# Patient Record
Sex: Female | Born: 1955 | Race: White | Hispanic: No | Marital: Married | Smoking: Never smoker
Health system: Southern US, Community
[De-identification: ages and names within clinical notes are randomized; demographics above are authoritative.]

## PROBLEM LIST (undated history)

## (undated) DIAGNOSIS — E079 Disorder of thyroid, unspecified: Secondary | ICD-10-CM

---

## 1999-03-25 ENCOUNTER — Ambulatory Visit (HOSPITAL_COMMUNITY): Admission: RE | Admit: 1999-03-25 | Discharge: 1999-03-25 | Payer: Self-pay | Admitting: Gastroenterology

## 1999-03-25 ENCOUNTER — Encounter: Payer: Self-pay | Admitting: Gastroenterology

## 1999-08-07 ENCOUNTER — Other Ambulatory Visit: Admission: RE | Admit: 1999-08-07 | Discharge: 1999-08-07 | Payer: Self-pay | Admitting: Family Medicine

## 1999-09-17 ENCOUNTER — Ambulatory Visit (HOSPITAL_COMMUNITY): Admission: RE | Admit: 1999-09-17 | Discharge: 1999-09-17 | Payer: Self-pay | Admitting: Obstetrics and Gynecology

## 1999-11-17 ENCOUNTER — Encounter: Payer: Self-pay | Admitting: Family Medicine

## 1999-11-17 ENCOUNTER — Encounter: Admission: RE | Admit: 1999-11-17 | Discharge: 1999-11-17 | Payer: Self-pay | Admitting: Family Medicine

## 2001-01-05 ENCOUNTER — Other Ambulatory Visit: Admission: RE | Admit: 2001-01-05 | Discharge: 2001-01-05 | Payer: Self-pay | Admitting: Obstetrics and Gynecology

## 2001-03-09 ENCOUNTER — Ambulatory Visit (HOSPITAL_COMMUNITY): Admission: RE | Admit: 2001-03-09 | Discharge: 2001-03-09 | Payer: Self-pay

## 2001-03-09 ENCOUNTER — Encounter (INDEPENDENT_AMBULATORY_CARE_PROVIDER_SITE_OTHER): Payer: Self-pay | Admitting: Specialist

## 2001-06-13 ENCOUNTER — Encounter (INDEPENDENT_AMBULATORY_CARE_PROVIDER_SITE_OTHER): Payer: Self-pay

## 2001-06-13 ENCOUNTER — Observation Stay (HOSPITAL_COMMUNITY): Admission: RE | Admit: 2001-06-13 | Discharge: 2001-06-14 | Payer: Self-pay | Admitting: Obstetrics and Gynecology

## 2002-10-05 ENCOUNTER — Other Ambulatory Visit: Admission: RE | Admit: 2002-10-05 | Discharge: 2002-10-05 | Payer: Self-pay | Admitting: Obstetrics and Gynecology

## 2002-10-06 ENCOUNTER — Encounter: Payer: Self-pay | Admitting: Obstetrics and Gynecology

## 2002-10-06 ENCOUNTER — Encounter: Admission: RE | Admit: 2002-10-06 | Discharge: 2002-10-06 | Payer: Self-pay | Admitting: Obstetrics and Gynecology

## 2003-08-07 ENCOUNTER — Ambulatory Visit (HOSPITAL_COMMUNITY): Admission: RE | Admit: 2003-08-07 | Discharge: 2003-08-07 | Payer: Self-pay | Admitting: Gastroenterology

## 2003-08-07 ENCOUNTER — Encounter (INDEPENDENT_AMBULATORY_CARE_PROVIDER_SITE_OTHER): Payer: Self-pay | Admitting: Specialist

## 2003-11-15 ENCOUNTER — Other Ambulatory Visit: Admission: RE | Admit: 2003-11-15 | Discharge: 2003-11-15 | Payer: Self-pay | Admitting: Obstetrics and Gynecology

## 2005-04-14 ENCOUNTER — Other Ambulatory Visit: Admission: RE | Admit: 2005-04-14 | Discharge: 2005-04-14 | Payer: Self-pay | Admitting: Obstetrics and Gynecology

## 2007-05-03 ENCOUNTER — Ambulatory Visit (HOSPITAL_COMMUNITY): Admission: RE | Admit: 2007-05-03 | Discharge: 2007-05-04 | Payer: Self-pay | Admitting: Orthopedic Surgery

## 2007-05-03 ENCOUNTER — Encounter (INDEPENDENT_AMBULATORY_CARE_PROVIDER_SITE_OTHER): Payer: Self-pay | Admitting: Orthopedic Surgery

## 2007-10-26 ENCOUNTER — Encounter: Admission: RE | Admit: 2007-10-26 | Discharge: 2007-10-26 | Payer: Self-pay | Admitting: Gastroenterology

## 2008-07-19 IMAGING — CT CT PELVIS W/ CM
2 of 5 series · 17 of 46 positions shown, 19 images · IV contrast (READICAT/WATER & [ID] OMNI 300)
Comparison: None.

CLINICAL DATA: Left lower quadrant abdominal pain. 
ABDOMEN CT WITH CONTRAST:
TECHNIQUE: Multidetector CT imaging of the abdomen was performed following the standard protocol during bolus administration of intravenous contrast.
Contrast:  100 cc Omnipaque 300
TECHNIQUE: Multidetector CT imaging of the pelvis was performed following the standard protocol during bolus administration of intravenous contrast.

[Series 3: routine abdomen · axial · 0.70mm/px · z∈[-344,-14]mm · 14 of 76 slices shown, 16 images]
[im 5/76  soft-tissue]
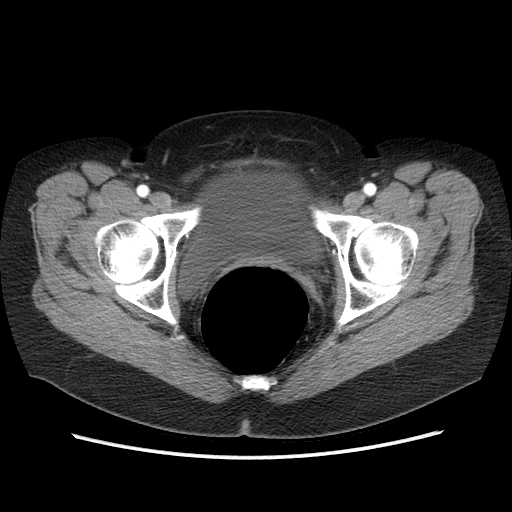
[im 5/76  bone]
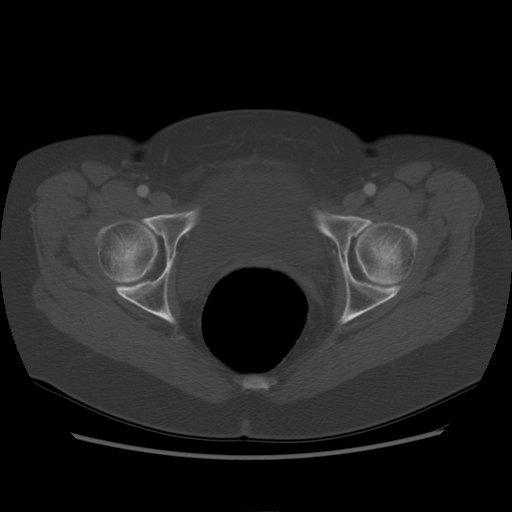
[im 9/76  soft-tissue]
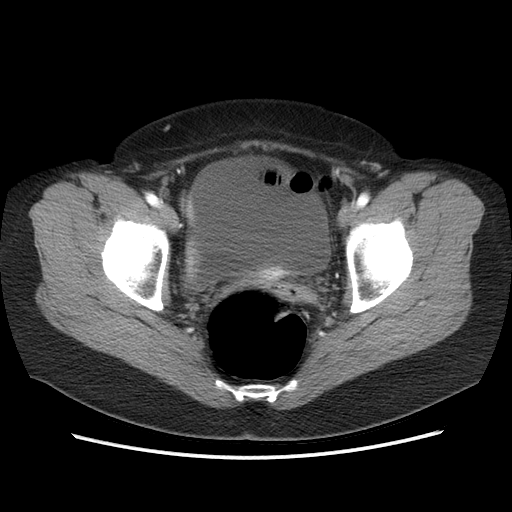
[im 17/76  soft-tissue]
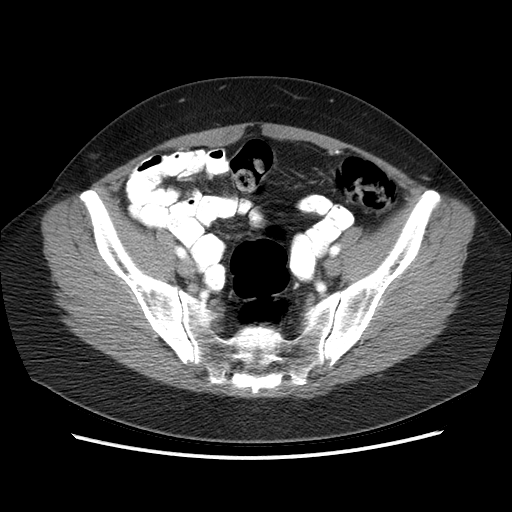
[im 21/76  soft-tissue]
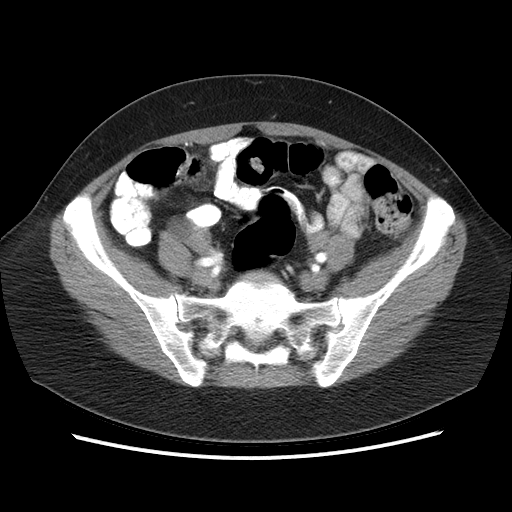
[im 26/76  soft-tissue]
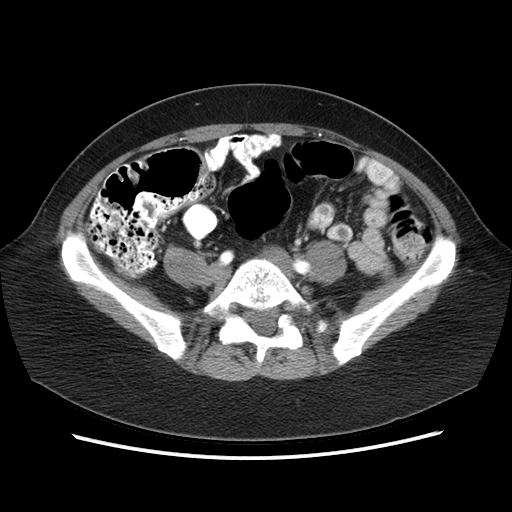
[im 30/76  soft-tissue]
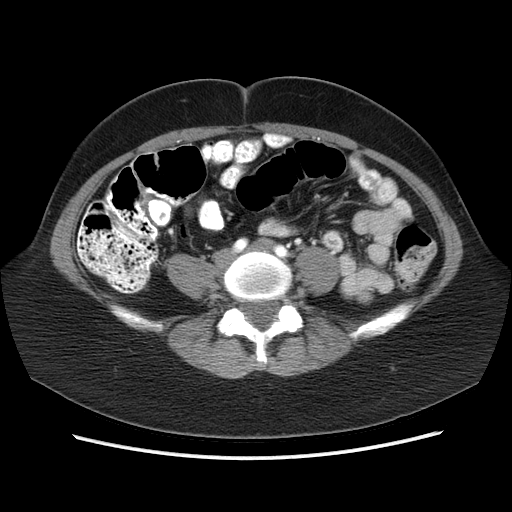
[im 34/76  soft-tissue]
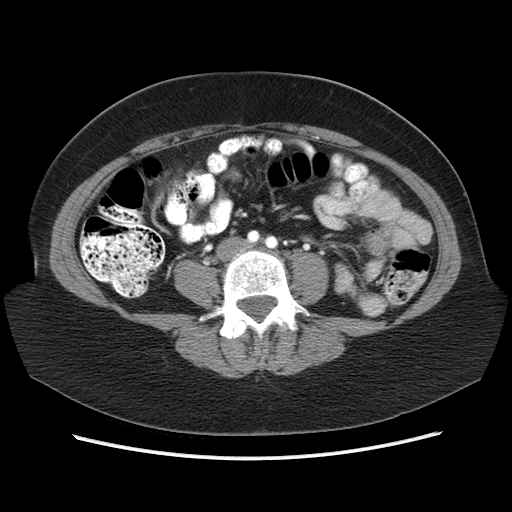
[im 42/76  soft-tissue]
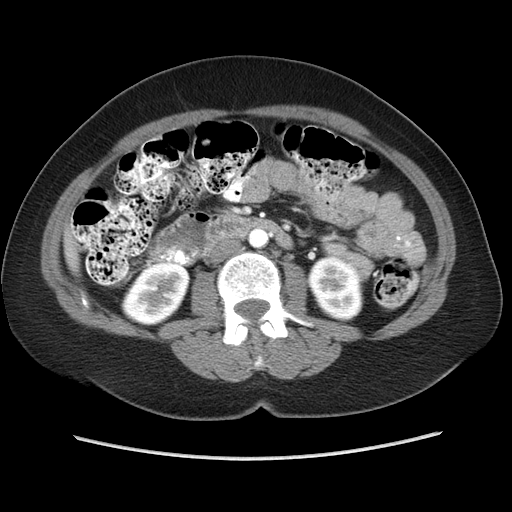
[im 46/76  soft-tissue]
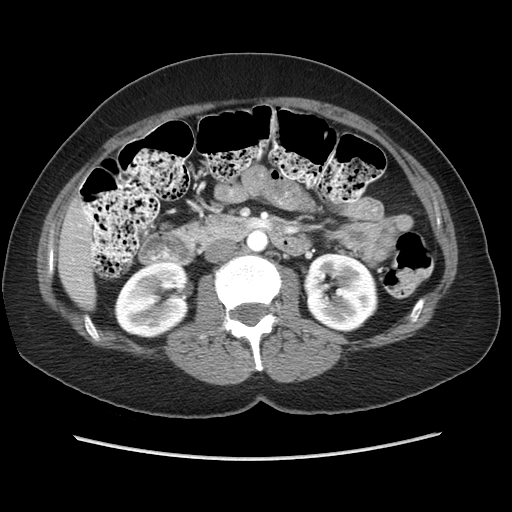
[im 46/76  bone]
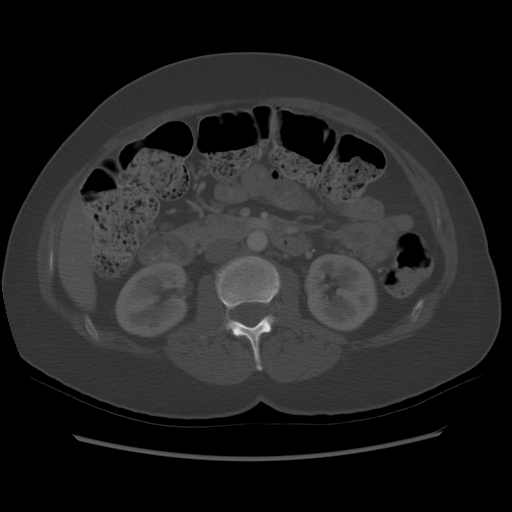
[im 51/76  soft-tissue]
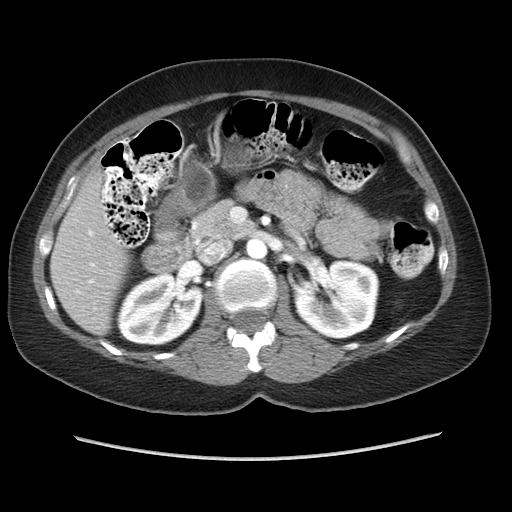
[im 55/76  soft-tissue]
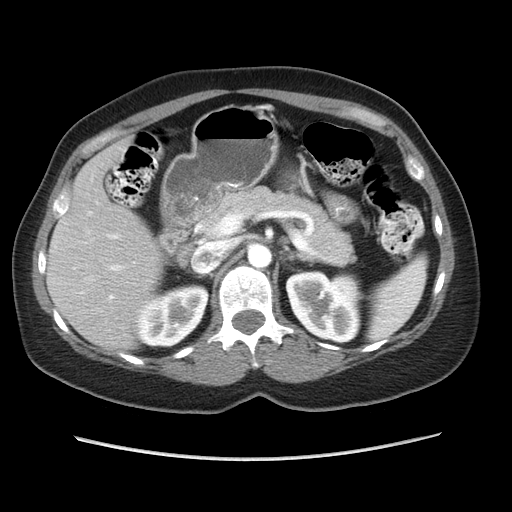
[im 59/76  soft-tissue]
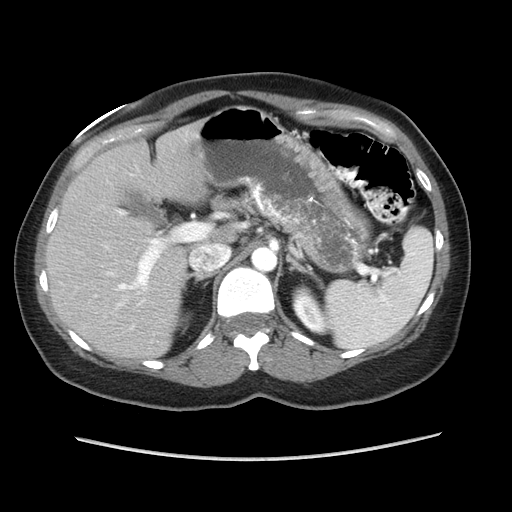
[im 67/76  soft-tissue]
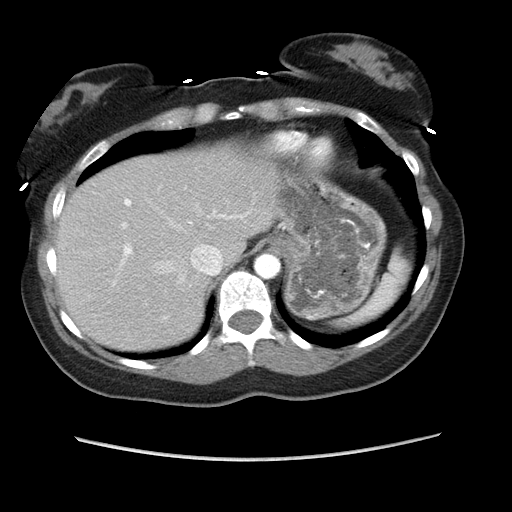
[im 71/76  soft-tissue]
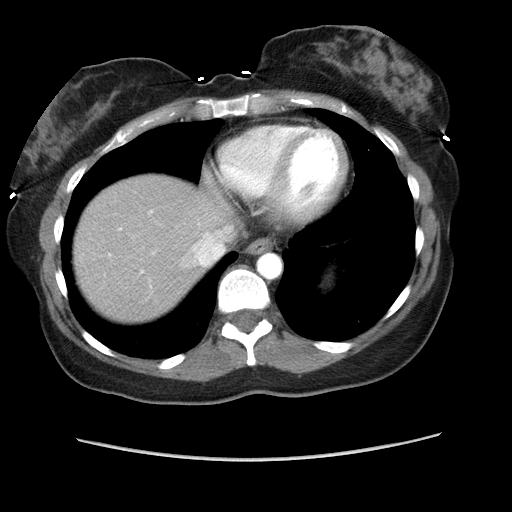

[Series 602: sagittal body · sagittal · 0.85mm/px · 3 of 145 slices shown]
[im 49/145  soft-tissue]
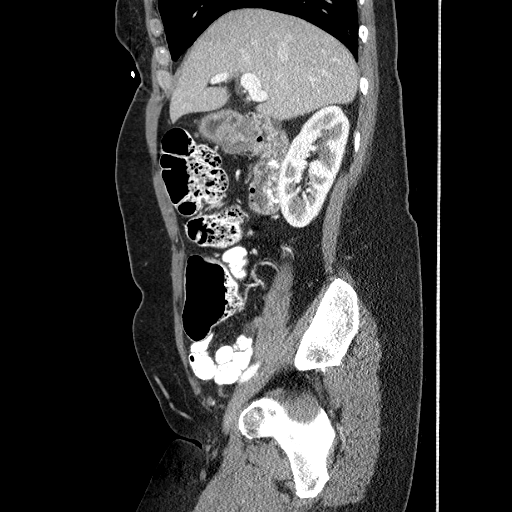
[im 65/145  soft-tissue]
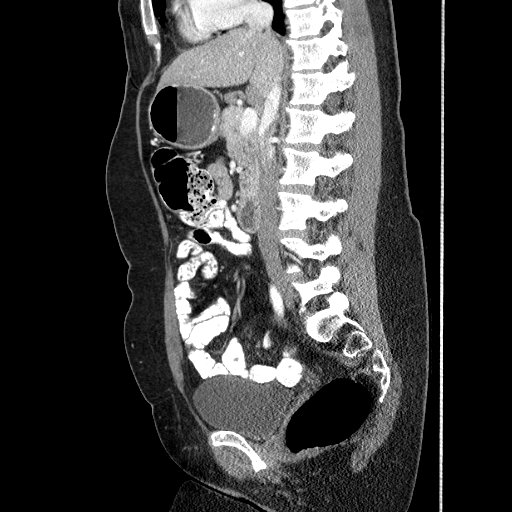
[im 81/145  soft-tissue]
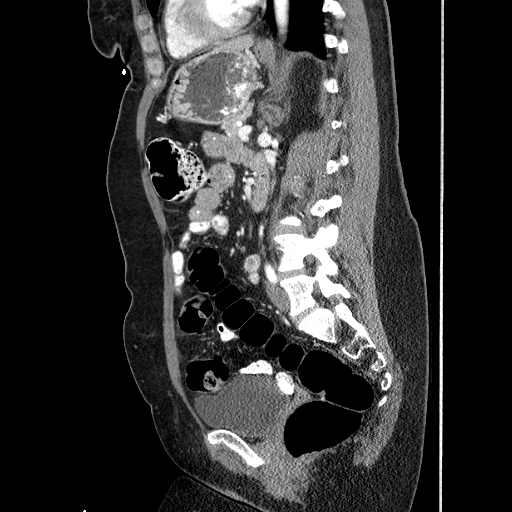

[17 of 46 positions shown; findings below may reference images not displayed]

FINDINGS: No focal abnormality is seen in the liver or spleen.  The stomach, duodenum, pancreas, gallbladder, adrenal glands, and kidneys have normal imaging features.  
There is no abdominal aortic aneurysm.  The patient has borderline hepatoduodenal ligament lymphadenopathy with a 1.4 x 1.9 cm lymph node seen in the portocaval space and a 1.1 x 1.3 cm portocaval lymph node evident.  There is no intraperitoneal free fluid.  Stool in the abdominal segments of the colon is somewhat prominent.
IMPRESSION: Borderline hepatoduodenal ligament lymphadenopathy.  Correlate clinically and consider a followup CT scan in 3 months to insure stability, as clinically warranted. 
PELVIS CT WITH CONTRAST:
FINDINGS: There is no free intraperitoneal fluid.  No pelvic lymphadenopathy.  The uterus is surgically absent.  There is no evidence for left adnexal mass.  18 mm cystic focus in the right ovary may be a dominant follicle or small cyst.  No substantial diverticular change is seen in the sigmoid colon.  There is no evidence for diverticulitis.  The appendix and the terminal ileum are normal. 
Bone windows are unremarkable.
IMPRESSION: 1.  Prominent stool throughout the length of the colon. 
2.  18 mm cystic focus in the right ovary may be a dominant follicle, especially if the patient is premenopausal.  In a postmenopausal patient, consider followup ultrasound to ensure that this lesion does not progress.

## 2011-01-13 NOTE — Op Note (Signed)
NAMELYNNETT, LANGLINAIS            ACCOUNT NO.:  [id number]   MEDICAL RECORD NO.:  [id number]          PATIENT TYPE:  AMB   LOCATION:  DAY                          FACILITY:  [facility]   PHYSICIAN:  Marlowe Kays, M.D.  DATE OF BIRTH:  [birth year]   DATE OF PROCEDURE:  05/03/2007  DATE OF DISCHARGE:                               OPERATIVE REPORT   PREOPERATIVE DIAGNOSIS:  Torn common extensor tendon, left elbow.   POSTOPERATIVE DIAGNOSIS:  Torn common extensor tendon, left elbow.   OPERATION:  Repair of common extensor tendon with partial lateral  condylectomy left elbow.   SURGEON:  [place]   ASSISTANT:  Mr. Adrian Blackwater.   ANESTHESIA:  General.   PATHOLOGY:  __________.   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 Because of failure to respond at this time to nonsurgical  treatment, she is here for surgical correction. At surgery, the MRI  findings were confirmed as discussed below.   PROCEDURE:  After prophylactic antibiotics, satisfactory general  anesthesia, pneumatic tourniquet, the left upper extremity was Esmarch  out non sterilely and tourniquet inflated to 225 mmHg. A time-out was  performed. The left upper extremity is prepped from the wrist to  tourniquet with DuraPrep, draped in sterile field.  I made a curved  incision midway between the lateral epicondyle and the olecranon,  carrying it down to the underlying thin fascia which I opened over the  lateral epicondylar area. There was discoloration  superficially over the  lateral epicondyle.  With a cutting cautery, I made a horizontal U  outlining the proximal extent of the common extensor tendon and then,  with sharp dissection, dissected it off the lateral epicondyle finding  the torn underneath surface of the common extensor tendon with partial  detachment. I debrided out all this abnormal, glassy-looking tissue and  sent it to pathology for verification. I then inspected the joint which  appeared to be normal.  With a half-inch curved osteotome, I have  perform a superficial lateral epicondylectomy getting into the raw bone  and then placed two parallel drill holes through the proximal extent and  took a zero Vicryl suture going through the lower two holes winding  through the common extensor tendon, then out through the upper two holes  with the elbow bent to 90 degrees and tension on this suture.  I then  repaired the perimeter of the common extensor tendon with the zero  Vicryl.  I then tied the suture through bone.  The wound was irrigated  well with sterile saline.  Soft tissues infiltrated with 0.5% plain  Marcaine.  The wound was  closed with interrupted 3-0 Vicryl in the thin  the fascia and  subcutaneous tissue. Steri-Strips on the skin. Dry sterile dressing and  long-arm splint cast were applied after releasing the tourniquet.  She  tolerated the procedure well and was taken to recovery in satisfactory  condition with no known complications.           ______________________________  Marlowe Kays, M.D.     JA/MEDQ  D:  05/03/2007  T:  05/03/2007  Job:  [id number]

## 2011-01-16 NOTE — H&P (Signed)
[facility]  Patient:    Annette Jordan                        MRN: [id number] Adm. Date:  [id number] Attending:  Trevor Iha                         History and Physical  HISTORY OF PRESENT ILLNESS:   Ms. Sibyl Parr is a 55 year old G3, P2 with desired sterility, who presents for bilateral tubal ligation.  Discussed contraceptive options at length including IUD.  She is not able to take oral contraceptive agents due to chronic hepatitis C, adamantly desires sterilization and presents today or that.  She has a history of an ectopic pregnancy in the past on the right side.  PAST MEDICAL HISTORY:         Hepatitis C, chronic.  FAMILY HISTORY:               Family history of thyroid disorder but she has normal PFTs.  PAST OBSTETRICAL HISTORY:     G3, P2 with one ectopic.  PAST SURGICAL HISTORY:        Ectopic removal, unsure if salpingectomy versus salpingostomy, tonsillectomy and liver biopsy.  MEDICATIONS:                  Vitamin E, Sumycin and ______ .  ALLERGIES:                    She has no known drug allergies.  PHYSICAL EXAMINATION:  VITAL SIGNS:                  Blood pressure is 118/70.  HEART:                        Regular rate and rhythm.  LUNGS:                        Clear to auscultation bilaterally.  ABDOMEN:                      Nondistended and nontender.  Uterus is anteverted, mobile, approximately 10-weeks-size.  No adnexal masses are palpable.  IMPRESSION:                   Multiparity; desired sterility.  PLAN:                         Laparoscopic bilateral tubal ligation by fulguration. Risks and benefits were discussed at length including, but not limited to, risks of infection; bleeding; damage to bowel, bladder, uterus, tubes, ovaries; risk f tubal failure as quoted at five-out-of-a-thousand failure rate.  Patient gives er informed consent. DD:  09/17/99 TD:  09/17/99 Job: [id number]

## 2011-01-16 NOTE — Discharge Summary (Signed)
[facility]  Patient:    Annette Jordan, Annette Jordan Visit Number: [id number] MRN: [id number]          Service Type: DSU Location: 9300 [id number] Attending Physician:  Trevor Iha Dictated by:   Lenoard Aden, M.D. Admit Date:  06/13/2001 Discharge Date: 06/14/2001                             Discharge Summary  HISTORY OF PRESENT ILLNESS:   Ms. Gancarz is a 55 year old with known fibroids and abnormal uterine bleeding and pelvic pain not responsive to [facility] or birth control pills, continuing to menometrorrhagia for 16 days with heavy bleeding, sometimes passing large golf-ball size clots and cramping. She presents for laparoscopically assisted vaginal hysterectomy.  HOSPITAL COURSE:              The patient was admitted the same day of surgery. She underwent a laparoscopy with lysis of adhesions for pelvic adhesions on the left adnexa as well as a laparoscopically assisted vaginal hysterectomy. The surgery was uncomplicated with estimated blood loss of 150 cc.  Her postoperative course was complicated by fever on postoperative day #1 to 100.6 and anemia with her hemoglobin on postoperative day #1 at 8.6. She remained afebrile after the initial fever and later that day had a hemoglobin of 8.8 with a white count of 7.4 and was discharged home. She was tolerating a regular diet at the time of discharge. The incisions were clean, dry and intact and she was ambulating on her own and taking pain medicine by mouth.  DISPOSITION:                  The patient will be discharged home to follow up in the office in two weeks.  DISCHARGE MEDICATIONS:        She was sent home with a prescription for Motrin 800 mg, #40, and Tylox, #30.  DISCHARGE INSTRUCTIONS:       She was told to return for fever of 100.5, increased pain or bleeding. Dictated by:   Lenoard Aden, M.D. Attending Physician:  Trevor Iha DD:  06/14/01 TD:  06/14/01 Job:  [id number]

## 2011-01-16 NOTE — H&P (Signed)
[facility]  Patient:    TERRA, AVENI Visit Number: [id number] MRN: [id number]          Service Type: DSU Location: 9300 [id number] Attending Physician:  Trevor Iha Dictated by:   Trevor Iha, M.D. Admit Date:  06/13/2001                           History and Physical  HISTORY OF PRESENT ILLNESS:   Ms. Zenaida Deed is a 55 year old with noted fibroids.  Recently had a D&C for abnormal uterine bleeding, heavy for one month.  Now continues to have menomenorrhagia with 16 days of heavy bleeding, sometimes passing large golf ball sized clots.  Also, pelvic pain and cramping.  Uterus is approximately 10 weeks size.  She presents for laparoscopically assisted vaginal hysterectomy.  PAST MEDICAL HISTORY:         Significant for hepatitis C which is chronic. Had a blood transfusion in 1983.  PAST SURGICAL HISTORY:        Ectopic pregnancy, tonsillectomy, liver biopsy.  PAST OBSTETRICAL HISTORY:     She has had two vaginal deliveries.  PAST GYNECOLOGICAL HISTORY:   Normal Pap smears, but history of a 10-12 weeks size fibroid uterus.  PHYSICAL EXAMINATION  VITAL SIGNS:                  Blood pressure 100/68.  HEART:                        Regular rate and rhythm.  LUNGS:                        Clear to auscultation bilaterally.  ABDOMEN:                      Nondistended, nontender.  PELVIC:                       Uterus is anteverted, approximately 10-12 weeks size, irregular, consistent with fibroids, mobile.  No adnexal masses are palpable.  LABORATORIES:                 Ultrasound confirms the above with multiple fibroids.  IMPRESSION AND PLAN:          Abnormal uterine bleeding, pelvic pain, probable fibroids, large uterus not responsive to conservative medical management. Patient desires definitive intervention and treatment, requests hysterectomy with or without oophorectomy.  Risks and benefits were discussed at  length which include, but not limited to, risk of infection, bleeding, damage to bowel, bladder, ureters, bleeding sometimes requiring transfusion, risks associated with a transfusion which include hepatitis, acquired immunodeficiency syndrome, or reaction to blood transfusion.  She does give her informed consent. Dictated by:   Trevor Iha, M.D. Attending Physician:  Trevor Iha DD:  06/13/01 TD:  06/13/01 Job: [phone] [id number] [id number]

## 2011-01-16 NOTE — Op Note (Signed)
[facility]  Patient:    Annette Jordan, Annette Jordan                     MRN: [id number] Proc. Date: 03/09/01 Attending:  Trevor Iha, M.D.                           Operative Report  PREOPERATIVE DIAGNOSES:       1. Abnormal uterine bleeding.                               2. Endometrial mass on sonohysterogram.  POSTOPERATIVE DIAGNOSES:      1. Abnormal uterine bleeding.                               2. Endometrial mass on sonohysterogram.                               3. Endometrial polyps.                               4. Fibroids.  PROCEDURE:                    Hysteroscopy, dilation and curettage with                               polypectomy.  SURGEON:                      Trevor Iha, M.D.  ANESTHESIA:                   Monitored anesthetic care and paracervical                               block.  ESTIMATED BLOOD LOSS:         20 cc.  SORBITOL DEFICIT:             30 cc.  INDICATIONS:                  This is a 55 year old with menometrorrhagia, known fibroids by ultrasound and pelvic exam, who had a sonohysterogram which showed an endometrial mass consistent with an endometrial polyp. The patient desires definitive surgical evaluation and treatment of the abnormal bleeding. Risk and benefits are discussed at length. Informed consent was obtained for hysteroscopy/D&C.  FINDINGS:                     Several small fundal polyps, intramural fibroids which distorted the endometrial cavity, normal-appearing ostia.  DESCRIPTION OF PROCEDURE:     After adequate analgesia, the patient was placed in the dorsal lithotomy position. She was sterilely prepped and draped. The bladder was sterilely drained. Graves speculum was placed. Paracervical block was placed with 1% Xylocaine. Tenaculum was placed on the anterior lip of the cervix. The uterus was sounded to 12 cm, easily dilated to a #23 Hegar dilator. Hysteroscope was inserted. The  above findings were noted. Polyp forceps were used to grasp the polyps. Sharp curettage was then performed throughout the endometrial lining until  it was felt that there was a gritty surface throughout the endometrial lining. A second look with a hysteroscope revealed several small polyp fragments remaining. These were removed with the polyp forceps. Hysteroscope confirmed the removal. At this time, the endometrial lining appeared to be normal appearance with the exception of the distorted endometrial lining underlying intramural fibroids, normal-appearing ostia. At this time, the hysteroscope was removed. The tenaculum was removed from the anterior lip of the cervix. Hemostasis was achieved with direct pressure and silver nitrate. The speculum was then removed. The patient tolerated the procedure well, was stable, and was transferred to recovery room. Sponge and instrument count was normal x 3. Estimated blood loss for the procedure was 20 cc. Sorbitol deficit was 30 cc.  DISPOSITION:                  The patient was discharged home with routine instruction sheet for D&C. She was given Toradol 30 mg IV after the surgery. She will follow up in the office in two to three weeks. DD:  03/09/01 TD:  03/09/01 Job: [id number]

## 2011-01-16 NOTE — Op Note (Signed)
[facility]  Patient:    Annette Jordan, Annette Jordan Visit Number: [id number] MRN: [id number]          Service Type: DSU Location: 9300 [id number] Attending Physician:  Trevor Iha Dictated by:   Trevor Iha, M.D. Proc. Date: 06/13/01 Admit Date:  06/13/2001                             Operative Report  PREOPERATIVE DIAGNOSES:       1. Fibroids, 10-12 weeks in size.                               2. Abnormal uterine bleeding.                               3. Pelvic pain.  POSTOPERATIVE DIAGNOSES:      1. Fibroids, 10-12 weeks in size.                               2. Abnormal uterine bleeding.                               3. Pelvic pain.                               4. Pelvic adhesions.  PROCEDURES:                   1. Laparoscopy.                               2. Lysis of adhesions.                               3. Laparoscopically assisted vaginal                                  hysterectomy.  SURGEON:                      Trevor Iha, M.D.  ASSISTANT:                    Freddy Finner, M.D.  ANESTHESIA:                   General endotracheal.  INDICATIONS:                  The patient is a 55 year old with 10-12 week sized fibroids by ultrasound and pelvic exam, with menometrorrhagia, with bleeding sixteen days heavy, not responsive to conservative medical management including D&C.  She also has pelvic pain which is intermittent.  She presents today for laparoscopically assisted vaginal hysterectomy.  The risks and benefits were discussed at length.  Informed consent was obtained.  See the history and physical for further details.  DESCRIPTION OF PROCEDURE:     After adequate analgesia, the patient was placed in the dorsal lithotomy position.  She was sterilely prepped and draped.  The bladder was sterilely  drained.  A Hulka tenaculum was placed on the anterior lip of the cervix.  A 1 cm infraumbilical skin incision was  made.  A Veress needle was inserted.  The abdomen was insufflated to dullness to percussion. Two 5 mm ports were placed to the left and right of midline two fingerbreadths above the pubic symphysis under direct visualization.  The findings at the time were an enlarged uterus with extensive adhesions of the left ovary to the uterus and to the pelvic side wall and adhesions in the cul-de-sac along the left cul-de-sac.  The right ovary was within normal limits, as was the appendix and liver.  Bipolar cautery and sharp Endoshears were used to dissect the left ovary from the uterus and remove some of the cul-de-sac adhesions. Care was taken to avoid some of the underlying vessels and the ureter.  A Ligasure instrument was used to cross the round ligament on the left side as well as along the utero-ovarian ligaments on the left side, freeing up scar tissue as well as ligating the utero-ovarian pedicle with good approximation and good hemostasis.  The right utero-ovarian ligament was also ligated and cut using the Ligasure instrument, approximately 2 cm down the round ligament. This was done similarly on the left side to just past the round ligament. After a moderate amount of adhesions were lysed using ______ scissors, bipolar cautery and Ligasure, the abdomen was desufflated and we began the vaginal portion of the procedure.  The legs were elevated.  A weighted speculum was placed.  A Jacobs tenaculum was placed in the posterior cervix. The cervix was circumscribed with Bovie cautery.  A posterior colpotomy was performed.  Ligasure instrument was used to cross the uterosacral ligaments bilaterally.  There was some difficulty getting into the posterior cul-de-sac. After the posterior peritoneum was found, it was placed in a pedicle with the cardinal ligaments and ligated with the Ligasure instrument.  This was performed up across the uterosacral ligaments, up across the cardinal ligaments  bilaterally and across the uterine vasculature.  At this time, the bladder was dissected off the anterior surface of the cervix.  Bladder pillars were ligated and cut with the Ligasure instrument.  A bladder blade was placed behind the bladder.  Successive bites was placed across the pedicles and the broad ligaments until the right broad ligament had been completely ligated an cut.  The left broad ligament was unable to reach the end.  The fundus was grasped with a tenaculum and delivered through the vagina.  The Ligasure instrument was placed across the remaining portion of the pedicle.  The uterus was removed.  The uterosacral ligament was grasped with Allis clamps and ligated with 0 Monocryl.  Small ribbon packing was placed.  Two pursestring sutures were placed across the posterior peritoneum, which was closed in a pursestring fashion.  The posterior vaginal mucosa was then closed with figure-of-eight 0 Monocryl.  The packing was then removed.  The anterior vaginal mucosa was then closed with 0 Monocryl figure-of-eight, with good approximation and good hemostasis.  A Foley catheter was then place with good return of clear urine.  Examination with the laparoscope revealed good hemostasis.  The cul-de-sac was suction irrigated at this time with good hemostasis assured.  The trocars were removed.  The infraumbilical skin incision was closed with 0 Vicryl in the fascia, a 3-0 Vicryl Rapide subcuticular stitch in the skin.  The 5 mm ports were closed with 3-0 Vicryl Rapide subcuticular stitch.  The incisions  were injected with 0.25% Marcaine.. The patient was stable on transfer to the recovery room.  Sponge, needle and instrument counts were normal x 3.  The patient received 1 g of cefotetan preoperatively. Dictated by:   Trevor Iha, M.D. Attending Physician:  Trevor Iha DD:  06/13/01 TD:  06/13/01 Job: [phone] [id number]

## 2011-01-16 NOTE — Op Note (Signed)
[facility]  Patient:    Annette Jordan                        MRN: [id number] Proc. Date: 09/17/99 Adm. Date:  [id number] Attending:  Trevor Iha                           Operative Report  PREOPERATIVE DIAGNOSIS:  Multiparity and desires sterility.  POSTOPERATIVE DIAGNOSIS: 1. Multiparity and desires sterility. 2. Pelvic adhesive disease and enlarged uterus.  OPERATION:  Laparoscopy with lysis of adhesions and bilateral tubal ligation.  SURGEON:  Trevor Iha, M.D.  ANESTHESIA:  General endotracheal.  INDICATIONS:  Annette Jordan is a 55 year old G3, P2 with multiparity and desires sterility.  Discussed contraceptive options at length.  Patient adamantly desires sterilization.  Risks and benefits were discussed at length including failure rate of 5 out of 1000.  Informed consent was obtained.  OPERATIVE FINDINGS:  Severe pelvic adhesions with an obliterated cul-de-sac between the tubes, ovaries, densely adhesed underneath the uterus.  The uterus is approximately 10 weeks size.  Lysis of adhesions was performed to free up the tube from the ovary and cul-de-sac so that tubal ligation could be performed. However, still extensive adhesions from the ovary to the posterior uterus remained.  The  appendix is retrocecal; however, it appeared normal, what little was seen.  The  liver and gallbladder were grossly normal in appearance.  The uterus was anteverted, boggy, approximately 10 weeks size, no obvious lesions.  DESCRIPTION OF PROCEDURE:  After adequate general anesthesia, the patient was placed in dorsal lithotomy position.  She was sterilely prepped and draped. Bladder was sterilely drained.  A Cohen tenaculum was placed on the cervix.  A 1 cm infraumbilical skin incision was made.  A Veress needle was inserted.  The  abdomen was insufflated with dullness to percussion.  An 11 mm trocar was inserted. The laparoscope was then  inserted and the above findings were noted.  A 5 mm port was inserted to the right of midline two fingerbreadths above the pubic symphysis. Lysis of adhesions using bipolar cautery and Endo shears were used to free the right tube from the ovary and the posterior portion of the uterus.  It did appear that there was interruption of the tube probably from the previous ectopic. The midportion of the tube above this area was grasped with bipolar cautery and approximately a 3 cm section of the tube was cauterized with bipolar cautery over the entirety of the tube to a loss of resistance on the ohmeter with good thermal burn noticed approximately a 3 cm section of the tube.  The left fallopian tube was sharply dissected from the posterior portion of the uterus, ovary and bowel using sharp Endo shears and bipolar cautery.  After the tube was freed somewhat, we were able to identify the tube by the fimbriated end.  The midportion of the tube was grasped, cauterized over a midportion of the tube.  Approximately a 3 cm section of the tube, good thermal burn throughout the entirety of the tube and loss of resistance of the ohmeter.  Re-examination of the tube showed good burn across he midportion of the tube, good hemostasis from the lysis of adhesions; however, a  significant amount of adhesions remained in the posterior cul-de-sac which probably are not amenable to laparoscopic resection.  At this time the trocars were then  removed.  The abdomen was desufflated.  The umbilical incision was closed with  Vicryl in the fascia and 3-0 Vicryl ____________ subcuticular stitch both for the umbilical lesion and the 5 mm port.  They were injected with 0.25% Marcaine. The tenaculum was removed.  The cervix was noted to be hemostatic.  The patient was  stable on transfer to the recovery room.  Estimated blood loss during the procedure was 20 cc.  DISPOSITION:  The patient will be discharged  home with a routine instruction sheet for laparoscopy.  She was sent home with a prescription for Darvocet #30 to follow up in the office in two to three weeks. DD:  09/17/99 TD:  09/17/99 Job: [id number]

## 2011-01-16 NOTE — H&P (Signed)
[facility]  Patient:    Annette Jordan, Annette Jordan                         MRN: [id number] Adm. Date:  03/09/01 Attending:  Trevor Iha, M.D.                         History and Physical  DATE OF BIRTH:                07/04/1956  HISTORY OF PRESENT ILLNESS:   Ms. Annette Jordan is a 55 year old, G2, P2, with abnormal uterine bleeding and slightly enlarged uterus.  She continues to have irregular spotting.  Status post tubal ligation.  She had an ultrasound showing a 10.8 x 5.9 x 6.8 cm uterus with multiple small fibroids.  The endometrial width is 4.1 mm.  During sonohysterogram, which did involve dilating the cervix, an elongated echogenic nodule at the fundus of the uterus was identified which may represent a polyp.  It was easier to see in the transverse position.  She presents for hysteroscopy and D&C for evaluation of this.  PAST MEDICAL HISTORY:  History of hepatitis C, chronic.  PAST SURGICAL HISTORY:  History of ectopic pregnancy, tonsillectomy, and a liver biopsy.  MEDICATIONS:  ______ and tetracycline.  ALLERGIES:  She has no known drug allergies.  PHYSICAL EXAMINATION:  The blood pressure is 100/68.  HEART:  Regular rate and rhythm.  LUNGS:  Clear to auscultation bilaterally.  ABDOMEN:  Nondistended and nontender.  PELVIC:  The uterus is slightly enlarged, consistent with a 10-12 week sized fibroid uterus.  Sonohysterogram as above.  IMPRESSION:  Abnormal uterine bleeding and abnormal sonohysterogram with endometrial mass at the fundus.  Recommend hysteroscopy and D&C for evaluation of this.  The risks and benefits were discussed at length.  Informed consent was obtained.  PLAN:  Hysteroscopy and D&C. DD:  03/08/01 TD:  03/08/01 Job: [id number]

## 2011-06-12 LAB — PROTIME-INR
INR: 0.9
Prothrombin Time: 12.8

## 2011-06-12 LAB — HEMOGLOBIN AND HEMATOCRIT, BLOOD
HCT: 38.8
Hemoglobin: 13.7

## 2011-06-12 LAB — APTT: aPTT: 32

## 2012-11-30 ENCOUNTER — Other Ambulatory Visit: Payer: Self-pay | Admitting: Obstetrics and Gynecology

## 2015-01-02 ENCOUNTER — Other Ambulatory Visit (HOSPITAL_COMMUNITY): Payer: Self-pay | Admitting: Obstetrics and Gynecology

## 2015-01-03 LAB — CYTOLOGY - PAP

## 2015-10-19 ENCOUNTER — Encounter (HOSPITAL_COMMUNITY): Payer: Self-pay | Admitting: Emergency Medicine

## 2015-10-19 ENCOUNTER — Emergency Department (HOSPITAL_COMMUNITY)
Admission: EM | Admit: 2015-10-19 | Discharge: 2015-10-20 | Disposition: A | Discharge status: Home / Self Care | Payer: BLUE CROSS/BLUE SHIELD | Attending: Emergency Medicine | Admitting: Emergency Medicine

## 2015-10-19 DIAGNOSIS — R1111 Vomiting without nausea: Secondary | ICD-10-CM

## 2015-10-19 DIAGNOSIS — Z8639 Personal history of other endocrine, nutritional and metabolic disease: Secondary | ICD-10-CM | POA: Insufficient documentation

## 2015-10-19 DIAGNOSIS — R509 Fever, unspecified: Secondary | ICD-10-CM | POA: Insufficient documentation

## 2015-10-19 DIAGNOSIS — R42 Dizziness and giddiness: Secondary | ICD-10-CM | POA: Diagnosis not present

## 2015-10-19 DIAGNOSIS — R51 Headache: Secondary | ICD-10-CM | POA: Diagnosis not present

## 2015-10-19 DIAGNOSIS — R112 Nausea with vomiting, unspecified: Secondary | ICD-10-CM | POA: Diagnosis present

## 2015-10-19 HISTORY — DX: Disorder of thyroid, unspecified: E07.9

## 2015-10-19 LAB — CBC
HCT: 41 % (ref 36.0–46.0)
Hemoglobin: 14.3 g/dL (ref 12.0–15.0)
MCH: 30.8 pg (ref 26.0–34.0)
MCHC: 34.9 g/dL (ref 30.0–36.0)
MCV: 88.2 fL (ref 78.0–100.0)
PLATELETS: 244 10*3/uL (ref 150–400)
RBC: 4.65 MIL/uL (ref 3.87–5.11)
RDW: 12.5 % (ref 11.5–15.5)
WBC: 8.9 10*3/uL (ref 4.0–10.5)

## 2015-10-19 MED ORDER — ONDANSETRON HCL 4 MG/2ML IJ SOLN
4.0000 mg | Freq: Once | INTRAMUSCULAR | Status: AC
  Administered 2015-10-19: 4 mg via INTRAVENOUS
  Filled 2015-10-19: qty 2

## 2015-10-19 NOTE — ED Notes (Signed)
Nurse drawing labs. 

## 2015-10-19 NOTE — ED Notes (Signed)
Bed: WA08 Expected date:  Expected time:  Means of arrival:  Comments: EMS 59yo F, N/V, near syncope

## 2015-10-19 NOTE — ED Notes (Signed)
Pt presents from via EMS, c/o 7 episodes of emesis and severe nausea, associated symptoms; chills and fever, postural dizziness, denies diarrhea abdominal pain or any other pertinent pain.

## 2015-10-19 NOTE — ED Notes (Signed)
Pt states she is feeling nauseous and "swimmy headed". Pt states that when she gets up the room spins. Pt states that a few weeks ago she had an URI

## 2015-10-19 NOTE — ED Provider Notes (Signed)
CSN: [id number]     Arrival date & time 10/19/15  2240 History  By signing my name below, I, Phillis Haggis, attest that this documentation has been prepared under the direction and in the presence of Maybelline Kolarik, MD. Electronically Signed: Phillis Haggis, ED Scribe. 10/19/2015. 1:11 AM.    Chief Complaint  Patient presents with  . Emesis  . Nausea   Patient is a 60 y.o. female presenting with vomiting. The history is provided by the patient. No language interpreter was used.  Emesis Severity:  Moderate Timing:  Intermittent Quality:  Stomach contents Progression:  Unchanged Chronicity:  New Recent urination:  Normal Context: not post-tussive   Relieved by:  Nothing Worsened by:  Nothing tried Ineffective treatments:  None tried Associated symptoms: chills and headaches   Associated symptoms: no abdominal pain and no diarrhea   Risk factors: no alcohol use   HPI Comments: Annette Jordan is a 60 y.o. Female with a hx of thyroid disease brought in by EMS who presents to the Emergency Department complaining of intermittent emesis x7 onset 7 hours ago. Pt reports associated severe nausea, chills, fever, and dizziness. Pt is on amoxicillin for a gum infection, last dose this morning on a full stomach. She does not know of any suspicious food intake prior to her symptoms starting. She reports normal BMs today. She denies sick contacts, diarrhea, cough, congestion, frequency, dysuria, abdominal pain.   Past Medical History  Diagnosis Date  . Thyroid disease    History reviewed. No pertinent past surgical history. No family history on file. Social History  Substance Use Topics  . Smoking status: Never Smoker   . Smokeless tobacco: None  . Alcohol Use: Yes     Comment: social drinker   OB History    No data available     Review of Systems  Constitutional: Positive for chills.  HENT: Negative for congestion.   Respiratory: Negative for cough and shortness of breath.    Cardiovascular: Negative for chest pain, palpitations and leg swelling.  Gastrointestinal: Positive for nausea and vomiting. Negative for abdominal pain and diarrhea.  Genitourinary: Negative for dysuria and frequency.  Neurological: Positive for headaches.  Psychiatric/Behavioral: Negative for confusion.  All other systems reviewed and are negative.     Allergies  Review of patient's allergies indicates no known allergies.  Home Medications   Prior to Admission medications   Not on File   BP 148/65 mmHg  Pulse 62  Temp(Src) 98.4 F (36.9 C) (Oral)  Resp 19  Ht 5\' 3"  (1.6 m)  Wt 130 lb (58.968 kg)  BMI 23.03 kg/m2  SpO2 100% Physical Exam  Constitutional: She is oriented to person, place, and time. She appears well-developed and well-nourished. No distress.  HENT:  Head: Normocephalic and atraumatic.  Mouth/Throat: Oropharynx is clear and moist. No oropharyngeal exudate.  Trachea midline  Eyes: Conjunctivae and EOM are normal. Pupils are equal, round, and reactive to light.  Neck: Trachea normal and normal range of motion. Neck supple. No JVD present. Carotid bruit is not present.  Cardiovascular: Normal rate and regular rhythm.  Exam reveals no gallop and no friction rub.   No murmur heard. Pulmonary/Chest: Effort normal and breath sounds normal. No stridor. She has no wheezes. She has no rales.  Abdominal: Soft. Bowel sounds are normal. She exhibits no mass. There is no tenderness. There is no rebound and no guarding.  Gassy throughout; possible small amount of stool in the transverse colon  Musculoskeletal: Normal  range of motion.  Lymphadenopathy:    She has no cervical adenopathy.  Neurological: She is alert and oriented to person, place, and time. She has normal reflexes. No cranial nerve deficit. She exhibits normal muscle tone. Coordination normal.  Cranial nerves 2-12 intact; intact distal pulses  Skin: Skin is warm and dry. She is not diaphoretic.   Psychiatric: She has a normal mood and affect. Her behavior is normal.  Nursing note and vitals reviewed.   ED Course  Procedures (including critical care time) DIAGNOSTIC STUDIES: Oxygen Saturation is 100% on RA, normal by my interpretation.    COORDINATION OF CARE: 11:26 PM-Discussed treatment plan which includes labs and UA with pt at bedside and pt agreed to plan.    Labs Review Labs Reviewed  COMPREHENSIVE METABOLIC PANEL - Abnormal; Notable for the following:    Potassium 3.4 (*)    CO2 20 (*)    Glucose, Bld 135 (*)    All other components within normal limits  URINALYSIS, ROUTINE W REFLEX MICROSCOPIC (NOT AT [facility]) - Abnormal; Notable for the following:    APPearance TURBID (*)    pH 8.5 (*)    Ketones, ur >80 (*)    Leukocytes, UA SMALL (*)    All other components within normal limits  URINE MICROSCOPIC-ADD ON - Abnormal; Notable for the following:    Squamous Epithelial / LPF 6-30 (*)    Bacteria, UA RARE (*)    All other components within normal limits  LIPASE, BLOOD  CBC  I-STAT TROPOININ, ED    Imaging Review Dg Abd Acute W/chest  10/20/2015  CLINICAL DATA:  Nausea and vomiting since 4 p.m. yesterday. EXAM: DG ABDOMEN ACUTE W/ 1V CHEST COMPARISON:  CT abdomen and pelvis 10/26/2007 FINDINGS: Normal heart size and pulmonary vascularity. No focal airspace disease or consolidation in the lungs. No blunting of costophrenic angles. No pneumothorax. Mediastinal contours appear intact. Scattered gas and stool in the colon. No small or large bowel distention. No free intra-abdominal air. No abnormal air-fluid levels. No radiopaque stones. Visualized bones appear intact. IMPRESSION: No evidence of active pulmonary disease. Normal nonobstructive bowel gas pattern. Electronically Signed   By: Burman Nieves M.D.   On: 10/20/2015 01:07   I have personally reviewed and evaluated these images and lab results as part of my medical decision-making.   EKG  Interpretation   Date/Time:  Sunday October 20 2015 00:28:45 EST Ventricular Rate:  65 PR Interval:  184 QRS Duration: 84 QT Interval:  438 QTC Calculation: 455 R Axis:   67 Text Interpretation:  Sinus rhythm Confirmed by [facility]  MD, Kanani Mowbray  ([id number]) on 10/20/2015 12:31:37 AM      MDM   Final diagnoses:  None    Results for orders placed or performed during the hospital encounter of 10/19/15  Lipase, blood  Result Value Ref Range   Lipase 33 11 - 51 U/L  Comprehensive metabolic panel  Result Value Ref Range   Sodium 139 135 - 145 mmol/L   Potassium 3.4 (L) 3.5 - 5.1 mmol/L   Chloride 107 101 - 111 mmol/L   CO2 20 (L) 22 - 32 mmol/L   Glucose, Bld 135 (H) 65 - 99 mg/dL   BUN 19 6 - 20 mg/dL   Creatinine, Ser 6.04 0.44 - 1.00 mg/dL   Calcium 9.5 8.9 - 54.0 mg/dL   Total Protein 6.9 6.5 - 8.1 g/dL   Albumin 3.7 3.5 - 5.0 g/dL   AST 29 15 - 41  U/L   ALT 33 14 - 54 U/L   Alkaline Phosphatase 60 38 - 126 U/L   Total Bilirubin 0.9 0.3 - 1.2 mg/dL   GFR calc non Af Amer >60 >60 mL/min   GFR calc Af Amer >60 >60 mL/min   Anion gap 12 5 - 15  CBC  Result Value Ref Range   WBC 8.9 4.0 - 10.5 K/uL   RBC 4.65 3.87 - 5.11 MIL/uL   Hemoglobin 14.3 12.0 - 15.0 g/dL   HCT 82.9 56.2 - 13.0 %   MCV 88.2 78.0 - 100.0 fL   MCH 30.8 26.0 - 34.0 pg   MCHC 34.9 30.0 - 36.0 g/dL   RDW 86.5 78.4 - 69.6 %   Platelets 244 150 - 400 K/uL  I-stat troponin, ED  Result Value Ref Range   Troponin i, poc 0.00 0.00 - 0.08 ng/mL   Comment 3           No results found.  Medications  ondansetron (ZOFRAN) injection 4 mg (4 mg Intravenous Given 10/19/15 2346)    EKG Interpretation  Date/Time:  Sunday October 20 2015 00:28:45 EST Ventricular Rate:  65 PR Interval:  184 QRS Duration: 84 QT Interval:  438 QTC Calculation: 455 R Axis:   67 Text Interpretation:  Sinus rhythm Confirmed by [facility]  MD, Chiquitta Matty ([id number]) on 10/20/2015 12:31:37 AM      Suspect viral etiology.   Exam, vitals and labs unremarkable.  No indication for advanced imaging.  Strict return precautions given   I personally performed the services described in this documentation, which was scribed in my presence. The recorded information has been reviewed and is accurate.      Cy Blamer, MD 10/20/15 [phone]

## 2015-10-20 ENCOUNTER — Emergency Department (HOSPITAL_COMMUNITY): Payer: BLUE CROSS/BLUE SHIELD

## 2015-10-20 LAB — URINE MICROSCOPIC-ADD ON

## 2015-10-20 LAB — COMPREHENSIVE METABOLIC PANEL
ALT: 33 U/L (ref 14–54)
ANION GAP: 12 (ref 5–15)
AST: 29 U/L (ref 15–41)
Albumin: 3.7 g/dL (ref 3.5–5.0)
Alkaline Phosphatase: 60 U/L (ref 38–126)
BUN: 19 mg/dL (ref 6–20)
CHLORIDE: 107 mmol/L (ref 101–111)
CO2: 20 mmol/L — AB (ref 22–32)
CREATININE: 0.74 mg/dL (ref 0.44–1.00)
Calcium: 9.5 mg/dL (ref 8.9–10.3)
Glucose, Bld: 135 mg/dL — ABNORMAL HIGH (ref 65–99)
POTASSIUM: 3.4 mmol/L — AB (ref 3.5–5.1)
SODIUM: 139 mmol/L (ref 135–145)
Total Bilirubin: 0.9 mg/dL (ref 0.3–1.2)
Total Protein: 6.9 g/dL (ref 6.5–8.1)

## 2015-10-20 LAB — URINALYSIS, ROUTINE W REFLEX MICROSCOPIC
Bilirubin Urine: NEGATIVE
GLUCOSE, UA: NEGATIVE mg/dL
Hgb urine dipstick: NEGATIVE
Ketones, ur: >80 mg/dL — AB
NITRITE: NEGATIVE
Protein, ur: NEGATIVE mg/dL
SPECIFIC GRAVITY, URINE: 1.016 (ref 1.005–1.030)
pH: 8.5 — ABNORMAL HIGH (ref 5.0–8.0)

## 2015-10-20 LAB — LIPASE, BLOOD: LIPASE: 33 U/L (ref 11–51)

## 2015-10-20 LAB — I-STAT TROPONIN, ED: TROPONIN I, POC: 0 ng/mL (ref 0.00–0.08)

## 2015-10-20 MED ORDER — DICYCLOMINE HCL 10 MG/ML IM SOLN
20.0000 mg | Freq: Once | INTRAMUSCULAR | Status: AC
  Administered 2015-10-20: 20 mg via INTRAMUSCULAR
  Filled 2015-10-20: qty 2

## 2015-10-20 MED ORDER — KETOROLAC TROMETHAMINE 30 MG/ML IJ SOLN
30.0000 mg | Freq: Once | INTRAMUSCULAR | Status: AC
  Administered 2015-10-20: 30 mg via INTRAVENOUS
  Filled 2015-10-20: qty 1

## 2015-10-20 MED ORDER — SODIUM CHLORIDE 0.9 % IV BOLUS (SEPSIS)
500.0000 mL | Freq: Once | INTRAVENOUS | Status: AC
  Administered 2015-10-20: 500 mL via INTRAVENOUS

## 2015-10-20 MED ORDER — SODIUM CHLORIDE 0.9 % IV BOLUS (SEPSIS)
1000.0000 mL | Freq: Once | INTRAVENOUS | Status: AC
  Administered 2015-10-20: 1000 mL via INTRAVENOUS

## 2015-10-20 MED ORDER — ONDANSETRON 8 MG PO TBDP: ORAL_TABLET | ORAL | Status: AC

## 2016-07-13 IMAGING — CR DG ABDOMEN ACUTE W/ 1V CHEST
3 series · 3 of 3 positions shown · non-contrast
Comparison: CT abdomen and pelvis 10/26/2007

CLINICAL DATA: Nausea and vomiting since 4 p.m. yesterday.

EXAM:
DG ABDOMEN ACUTE W/ 1V CHEST

[w abdomen decub]
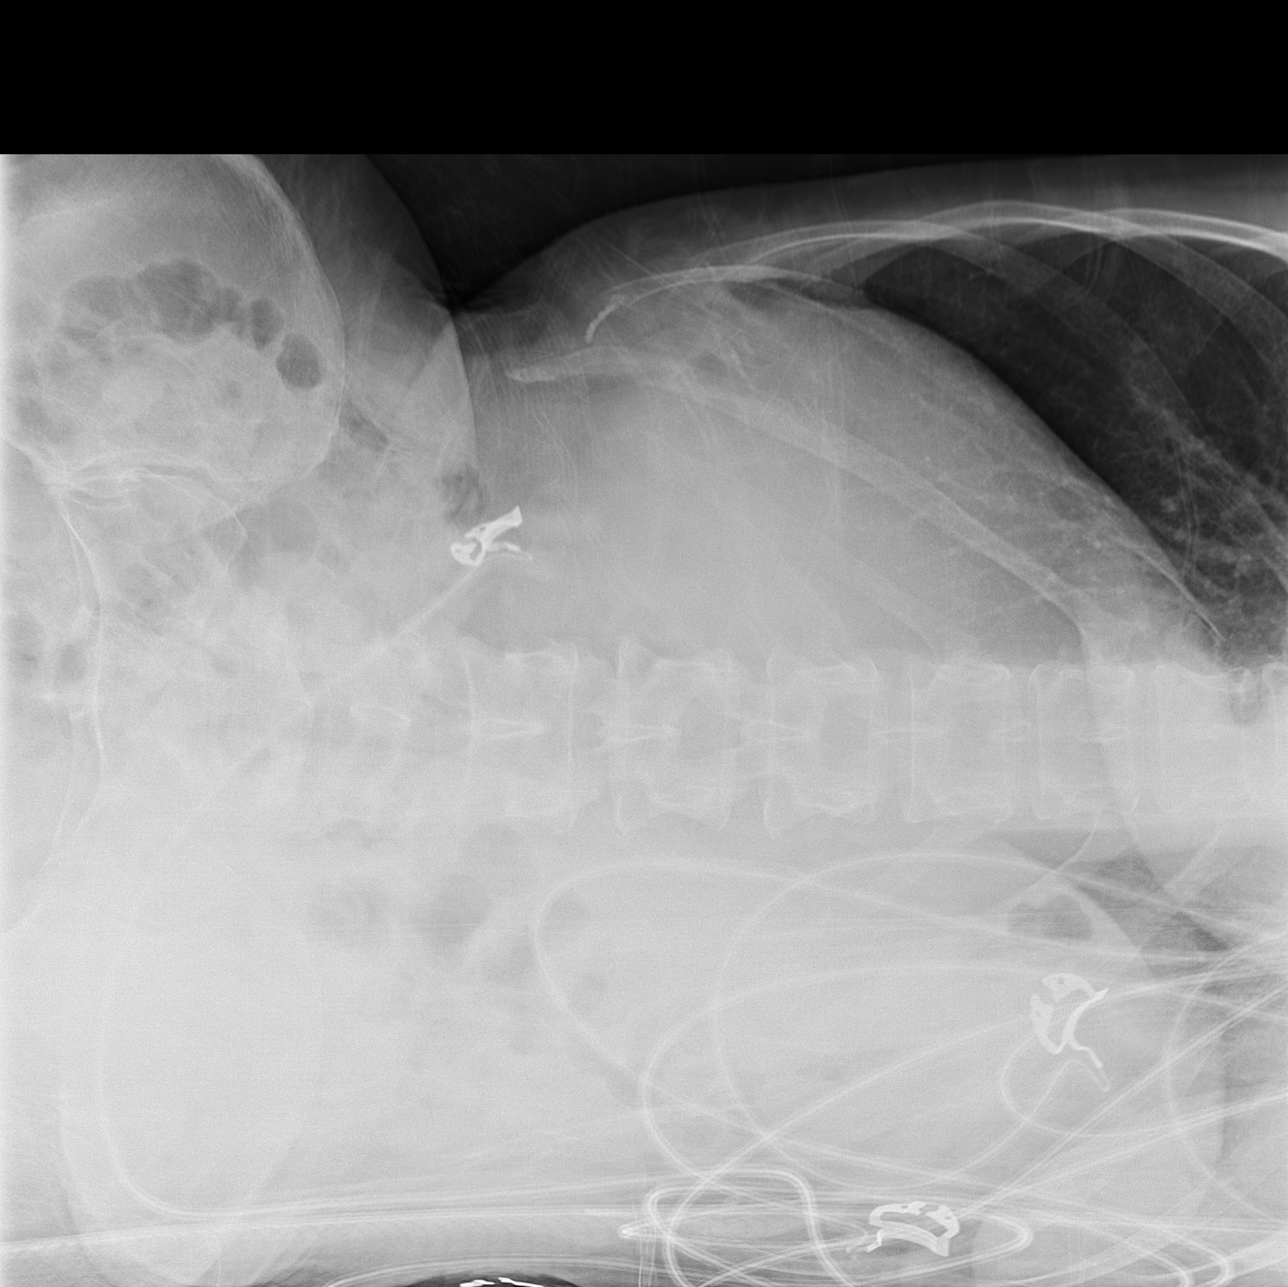

[x abdomen supine]
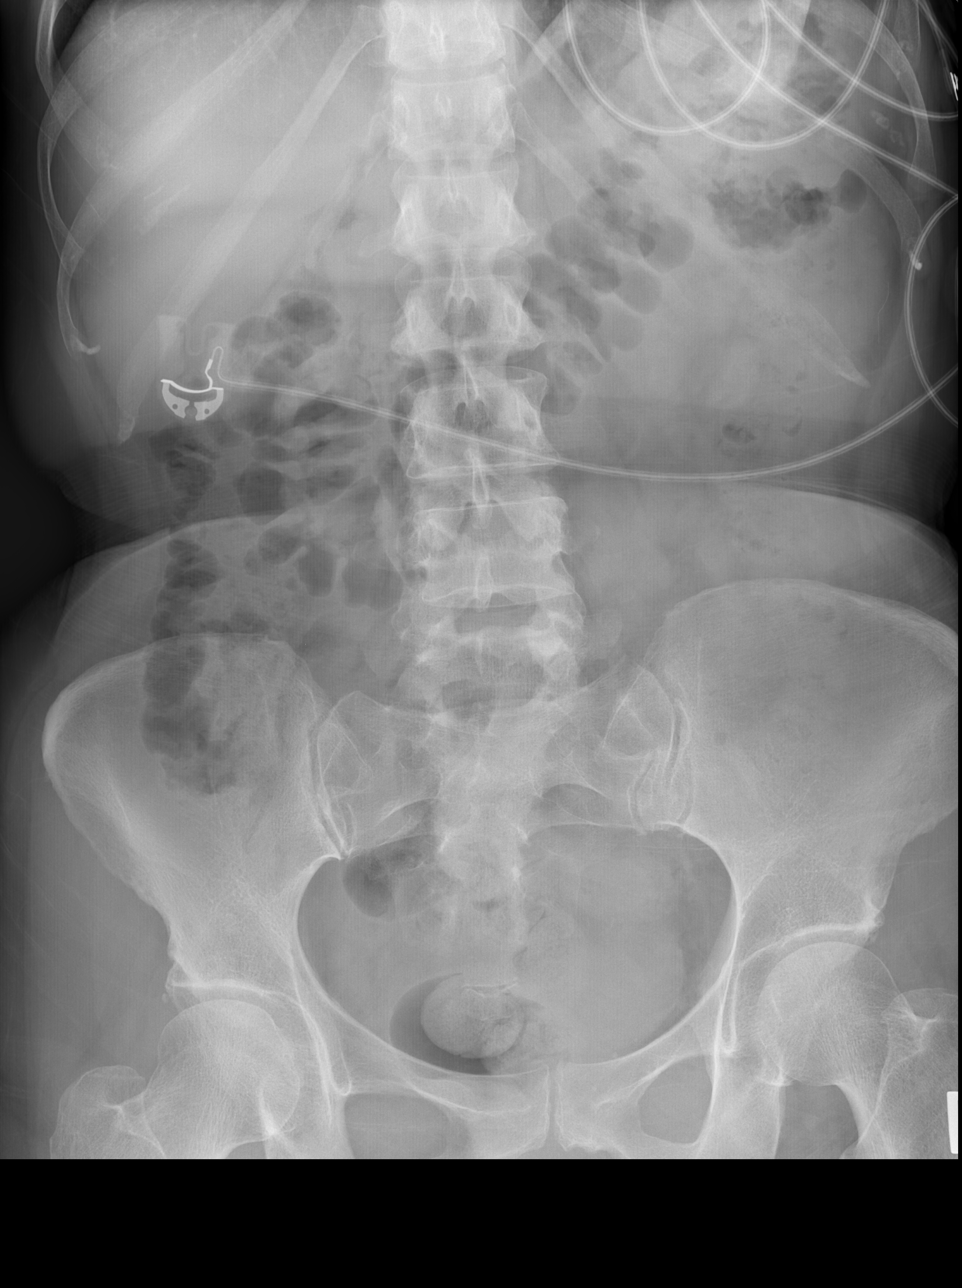

[x chest ap]
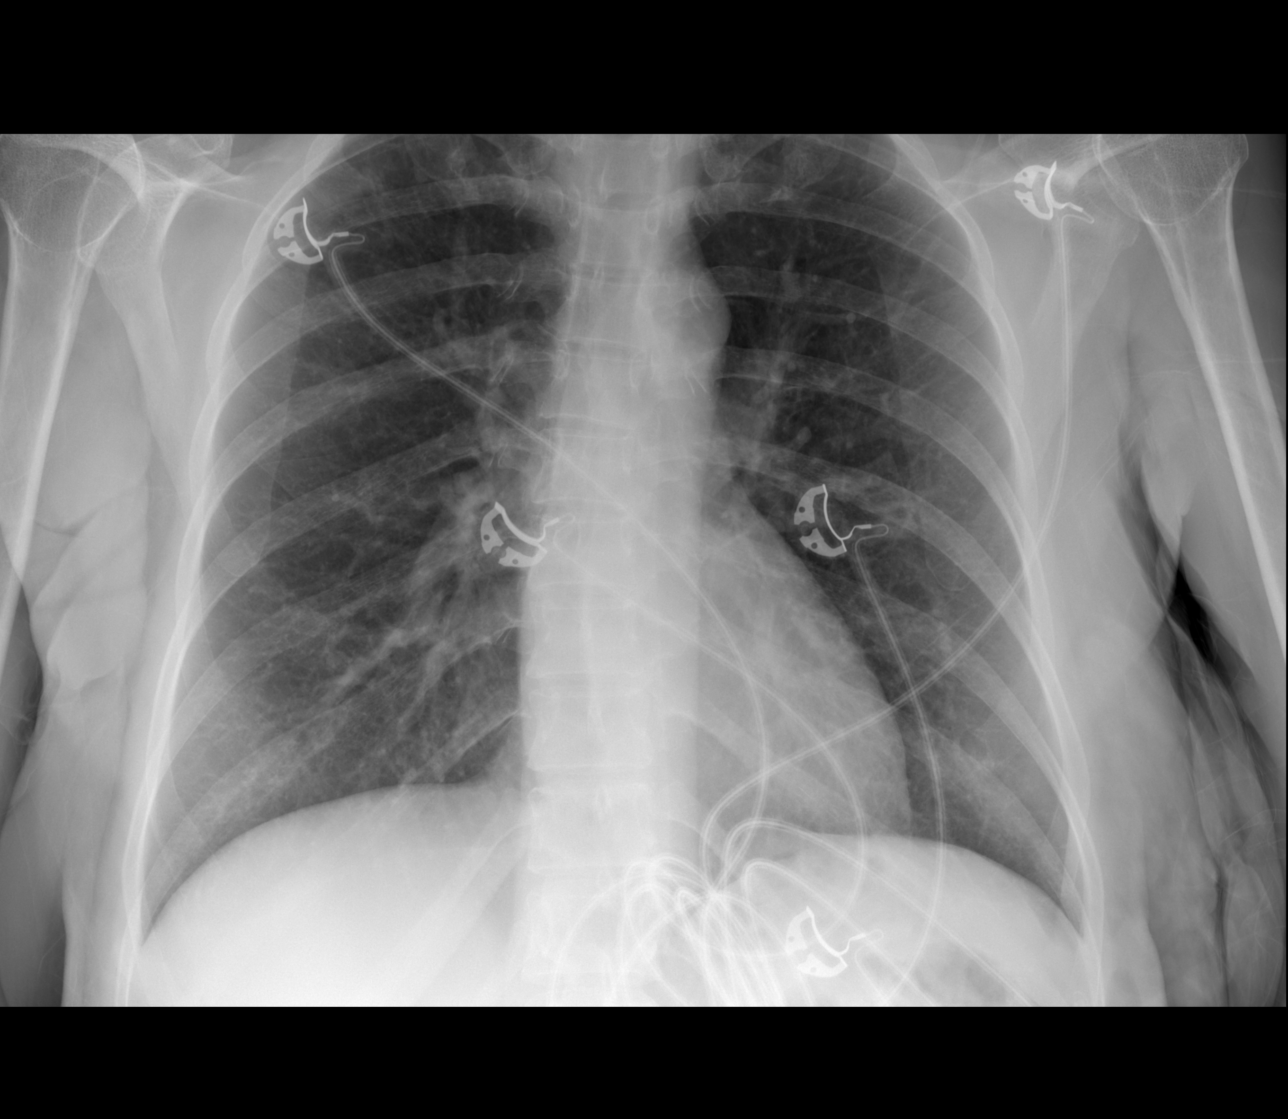

[3 of 3 positions shown; findings below may reference images not displayed]

FINDINGS: Normal heart size and pulmonary vascularity. No focal airspace
disease or consolidation in the lungs. No blunting of costophrenic
angles. No pneumothorax. Mediastinal contours appear intact.

Scattered gas and stool in the colon. No small or large bowel
distention. No free intra-abdominal air. No abnormal air-fluid
levels. No radiopaque stones. Visualized bones appear intact.
IMPRESSION: No evidence of active pulmonary disease. Normal nonobstructive bowel
gas pattern.
